# Patient Record
Sex: Male | Born: 1980 | Race: Black or African American | Hispanic: No | Marital: Married | Smoking: Never smoker
Health system: Southern US, Community
[De-identification: ages and names within clinical notes are randomized; demographics above are authoritative.]

## PROBLEM LIST (undated history)

## (undated) DIAGNOSIS — J40 Bronchitis, not specified as acute or chronic: Secondary | ICD-10-CM

---

## 2018-11-29 ENCOUNTER — Ambulatory Visit
Admission: EM | Admit: 2018-11-29 | Discharge: 2018-11-29 | Disposition: A | Discharge status: Home / Self Care | Payer: 59 | Attending: Family Medicine | Admitting: Family Medicine

## 2018-11-29 ENCOUNTER — Encounter: Payer: Self-pay | Admitting: Emergency Medicine

## 2018-11-29 DIAGNOSIS — R51 Headache: Secondary | ICD-10-CM

## 2018-11-29 DIAGNOSIS — R05 Cough: Secondary | ICD-10-CM

## 2018-11-29 DIAGNOSIS — R69 Illness, unspecified: Principal | ICD-10-CM

## 2018-11-29 DIAGNOSIS — J111 Influenza due to unidentified influenza virus with other respiratory manifestations: Secondary | ICD-10-CM

## 2018-11-29 MED ORDER — ONDANSETRON 4 MG PO TBDP: 4.0000 mg | ORAL_TABLET | Freq: Three times a day (TID) | ORAL | 0 refills | Status: DC | PRN

## 2018-11-29 MED ORDER — PSEUDOEPH-BROMPHEN-DM 30-2-10 MG/5ML PO SYRP: 5.0000 mL | ORAL_SOLUTION | Freq: Four times a day (QID) | ORAL | 0 refills | Status: DC | PRN

## 2018-11-29 MED ORDER — ONDANSETRON 4 MG PO TBDP
4.0000 mg | ORAL_TABLET | Freq: Once | ORAL | Status: AC
  Administered 2018-11-29: 4 mg via ORAL

## 2018-11-29 MED ORDER — FLUTICASONE PROPIONATE 50 MCG/ACT NA SUSP: 1.0000 | Freq: Every day | NASAL | 0 refills | Status: DC

## 2018-11-29 NOTE — Discharge Instructions (Signed)
You likely having a viral upper respiratory infection. We recommended symptom control. I expect your symptoms to start improving in the next 1-2 weeks.   Zofran as needed for nausea/vomiting  1. Take a daily allergy pill/anti-histamine like Zyrtec, Claritin, or Store brand consistently for 2 weeks  2. For congestion you may try an oral decongestant like Mucinex or sudafed. You may also try intranasal flonase nasal spray or saline irrigations (neti pot, sinus cleanse)  3. For your sore throat you may try cepacol lozenges, salt water gargles, throat spray. Treatment of congestion may also help your sore throat.  4. For cough you may try Cough syrup provided, Delsym Robitussen, Mucinex DM  5. Take Tylenol or Ibuprofen to help with pain/inflammation  6. Stay hydrated, drink plenty of fluids to keep throat coated and less irritated  Honey Tea For cough/sore throat try using a honey-based tea. Use 3 teaspoons of honey with juice squeezed from half lemon. Place shaved pieces of ginger into 1/2-1 cup of water and warm over stove top. Then mix the ingredients and repeat every 4 hours as needed.

## 2018-11-29 NOTE — ED Notes (Signed)
Patient able to ambulate independently  

## 2018-11-29 NOTE — ED Triage Notes (Signed)
Patient complains of cough, headache, vomiting, and diarrhea X 3-4 days.

## 2018-11-30 NOTE — ED Provider Notes (Signed)
[facility]    CSN: [id number] Arrival date & time: 11/29/18  1741     History   Chief Complaint Chief Complaint  Patient presents with  . Flu-Like Symptoms    HPI Andrew Lara is a 38 y.o. male no contribute past medical history presenting today for evaluation of URI symptoms.  Patient states that he has had a cough, headache.  He is also had a few episodes of vomiting and diarrhea.  Symptoms have been going on for approximately 3 to 4 days.  He has felt feverish, but has not had any known measured fevers.  Denies sore throat.  HPI  History reviewed. No pertinent past medical history.  There are no active problems to display for this patient.   History reviewed. No pertinent surgical history.     Home Medications    Prior to Admission medications   Medication Sig Start Date End Date Taking? Authorizing Provider  brompheniramine-pseudoephedrine-DM 30-2-10 MG/5ML syrup Take 5 mLs by mouth 4 (four) times daily as needed. 11/29/18   Mattisyn Cardona C, PA-C  fluticasone (FLONASE) 50 MCG/ACT nasal spray Place 1-2 sprays into both nostrils daily. 11/29/18   Tanisa Lagace C, PA-C  ondansetron (ZOFRAN-ODT) 4 MG disintegrating tablet Take 1 tablet (4 mg total) by mouth every 8 (eight) hours as needed for nausea or vomiting. 11/29/18   Dineen Conradt, Junius Creamer, PA-C    Family History History reviewed. No pertinent family history.  Social History Social History   Tobacco Use  . Smoking status: Never Smoker  . Smokeless tobacco: Never Used  Substance Use Topics  . Alcohol use: Not Currently  . Drug use: Never     Allergies   Patient has no known allergies.   Review of Systems Review of Systems  Constitutional: Positive for chills and fatigue. Negative for activity change, appetite change and fever.  HENT: Positive for congestion, rhinorrhea and sore throat. Negative for ear pain, sinus pressure and trouble swallowing.   Eyes: Negative for discharge and redness.    Respiratory: Positive for cough. Negative for chest tightness and shortness of breath.   Cardiovascular: Negative for chest pain.  Gastrointestinal: Positive for nausea and vomiting. Negative for abdominal pain and diarrhea.  Musculoskeletal: Negative for myalgias.  Skin: Negative for rash.  Neurological: Negative for dizziness, light-headedness and headaches.     Physical Exam Triage Vital Signs ED Triage Vitals  Enc Vitals Group     BP 11/29/18 1758 131/85     Pulse Rate 11/29/18 1758 98     Resp 11/29/18 1758 16     Temp 11/29/18 1758 97.8 F (36.6 C)     Temp Source 11/29/18 1758 Oral     SpO2 11/29/18 1758 96 %     Weight --      Height --      Head Circumference --      Peak Flow --      Pain Score 11/29/18 1801 3     Pain Loc --      Pain Edu? --      Excl. in GC? --    No data found.  Updated Vital Signs BP 131/85 (BP Location: Left Arm)   Pulse 98   Temp 97.8 F (36.6 C) (Oral)   Resp 16   SpO2 96%   Visual Acuity Right Eye Distance:   Left Eye Distance:   Bilateral Distance:    Right Eye Near:   Left Eye Near:    Bilateral Near:  Physical Exam Vitals signs and nursing note reviewed.  Constitutional:      Appearance: He is well-developed.  HENT:     Head: Normocephalic and atraumatic.     Ears:     Comments: Bilateral ears without tenderness to palpation of external auricle, tragus and mastoid, EAC's without erythema or swelling, TM's with good bony landmarks and cone of light. Non erythematous.    Mouth/Throat:     Comments: Oral mucosa pink and moist, no tonsillar enlargement or exudate. Posterior pharynx patent and nonerythematous, no uvula deviation or swelling. Normal phonation. Eyes:     Conjunctiva/sclera: Conjunctivae normal.  Neck:     Musculoskeletal: Neck supple.  Cardiovascular:     Rate and Rhythm: Normal rate and regular rhythm.     Heart sounds: No murmur.  Pulmonary:     Effort: Pulmonary effort is normal. No respiratory  distress.     Breath sounds: Normal breath sounds.     Comments: Breathing comfortably at rest, CTABL, no wheezing, rales or other adventitious sounds auscultated Abdominal:     Palpations: Abdomen is soft.     Tenderness: There is no abdominal tenderness.  Skin:    General: Skin is warm and dry.  Neurological:     Mental Status: He is alert.      UC Treatments / Results  Labs (all labs ordered are listed, but only abnormal results are displayed) Labs Reviewed - No data to display  EKG None  Radiology No results found.  Procedures Procedures (including critical care time)  Medications Ordered in UC Medications  ondansetron (ZOFRAN-ODT) disintegrating tablet 4 mg (4 mg Oral Given 11/29/18 1833)    Initial Impression / Assessment and Plan / UC Course  I have reviewed the triage vital signs and the nursing notes.  Pertinent labs & imaging results that were available during my care of the patient were reviewed by me and considered in my medical decision making (see chart for details).     URI symptoms x3 to 4 days with associated GI upset, vital signs stable, exam nonfocal.  Most likely viral etiology, possible influenza.  Will recommend symptomatic and supportive care, continue to monitor.  Recommendations below.Discussed strict return precautions. Patient verbalized understanding and is agreeable with plan.  Final Clinical Impressions(s) / UC Diagnoses   Final diagnoses:  Influenza-like illness     Discharge Instructions     You likely having a viral upper respiratory infection. We recommended symptom control. I expect your symptoms to start improving in the next 1-2 weeks.   Zofran as needed for nausea/vomiting  1. Take a daily allergy pill/anti-histamine like Zyrtec, Claritin, or Store brand consistently for 2 weeks  2. For congestion you may try an oral decongestant like Mucinex or sudafed. You may also try intranasal flonase nasal spray or saline irrigations  (neti pot, sinus cleanse)  3. For your sore throat you may try cepacol lozenges, salt water gargles, throat spray. Treatment of congestion may also help your sore throat.  4. For cough you may try Cough syrup provided, Delsym Robitussen, Mucinex DM  5. Take Tylenol or Ibuprofen to help with pain/inflammation  6. Stay hydrated, drink plenty of fluids to keep throat coated and less irritated  Honey Tea For cough/sore throat try using a honey-based tea. Use 3 teaspoons of honey with juice squeezed from half lemon. Place shaved pieces of ginger into 1/2-1 cup of water and warm over stove top. Then mix the ingredients and repeat every 4 hours as  needed.   ED Prescriptions    Medication Sig Dispense Auth. Provider   brompheniramine-pseudoephedrine-DM 30-2-10 MG/5ML syrup Take 5 mLs by mouth 4 (four) times daily as needed. 120 mL Lannette Avellino C, PA-C   fluticasone (FLONASE) 50 MCG/ACT nasal spray Place 1-2 sprays into both nostrils daily. 1 g Advika Mclelland C, PA-C   ondansetron (ZOFRAN-ODT) 4 MG disintegrating tablet Take 1 tablet (4 mg total) by mouth every 8 (eight) hours as needed for nausea or vomiting. 20 tablet Travonte Byard, WestonHallie C, PA-C     Controlled Substance Prescriptions [place] Controlled Substance Registry consulted? Not Applicable   Lew DawesWieters, Edan Juday C, [place] 11/30/18 2045

## 2019-02-17 ENCOUNTER — Encounter (HOSPITAL_COMMUNITY): Payer: Self-pay | Admitting: Emergency Medicine

## 2019-02-17 ENCOUNTER — Other Ambulatory Visit: Payer: Self-pay

## 2019-02-17 ENCOUNTER — Emergency Department (HOSPITAL_COMMUNITY)
Admission: EM | Admit: 2019-02-17 | Discharge: 2019-02-18 | Disposition: A | Discharge status: Home / Self Care | Payer: 59 | Attending: Emergency Medicine | Admitting: Emergency Medicine

## 2019-02-17 DIAGNOSIS — R5383 Other fatigue: Secondary | ICD-10-CM | POA: Diagnosis present

## 2019-02-17 DIAGNOSIS — R319 Hematuria, unspecified: Secondary | ICD-10-CM | POA: Diagnosis not present

## 2019-02-17 DIAGNOSIS — Z20828 Contact with and (suspected) exposure to other viral communicable diseases: Secondary | ICD-10-CM | POA: Diagnosis not present

## 2019-02-17 DIAGNOSIS — R112 Nausea with vomiting, unspecified: Secondary | ICD-10-CM | POA: Insufficient documentation

## 2019-02-17 DIAGNOSIS — J989 Respiratory disorder, unspecified: Secondary | ICD-10-CM | POA: Diagnosis present

## 2019-02-17 DIAGNOSIS — R05 Cough: Secondary | ICD-10-CM | POA: Insufficient documentation

## 2019-02-17 DIAGNOSIS — E860 Dehydration: Secondary | ICD-10-CM | POA: Diagnosis not present

## 2019-02-17 DIAGNOSIS — R509 Fever, unspecified: Secondary | ICD-10-CM | POA: Diagnosis not present

## 2019-02-17 DIAGNOSIS — R1032 Left lower quadrant pain: Secondary | ICD-10-CM | POA: Insufficient documentation

## 2019-02-17 LAB — CBC
HCT: 47.8 % (ref 39.0–52.0)
Hemoglobin: 15.5 g/dL (ref 13.0–17.0)
MCH: 26.5 pg (ref 26.0–34.0)
MCHC: 32.4 g/dL (ref 30.0–36.0)
MCV: 81.6 fL (ref 80.0–100.0)
Platelets: 340 10*3/uL (ref 150–400)
RBC: 5.86 MIL/uL — ABNORMAL HIGH (ref 4.22–5.81)
RDW: 13.6 % (ref 11.5–15.5)
WBC: 10.4 10*3/uL (ref 4.0–10.5)
nRBC: 0 % (ref 0.0–0.2)

## 2019-02-17 LAB — COMPREHENSIVE METABOLIC PANEL
ALT: 26 U/L (ref 0–44)
AST: 20 U/L (ref 15–41)
Albumin: 4.4 g/dL (ref 3.5–5.0)
Alkaline Phosphatase: 93 U/L (ref 38–126)
Anion gap: 14 (ref 5–15)
BUN: 6 mg/dL (ref 6–20)
CO2: 21 mmol/L — ABNORMAL LOW (ref 22–32)
Calcium: 9.7 mg/dL (ref 8.9–10.3)
Chloride: 103 mmol/L (ref 98–111)
Creatinine, Ser: 1.33 mg/dL — ABNORMAL HIGH (ref 0.61–1.24)
GFR calc Af Amer: >60 mL/min (ref >60)
GFR calc non Af Amer: >60 mL/min (ref >60)
Glucose, Bld: 113 mg/dL — ABNORMAL HIGH (ref 70–99)
Potassium: 3.8 mmol/L (ref 3.5–5.1)
Sodium: 138 mmol/L (ref 135–145)
Total Bilirubin: 1.2 mg/dL (ref 0.3–1.2)
Total Protein: 8 g/dL (ref 6.5–8.1)

## 2019-02-17 LAB — LIPASE, BLOOD: Lipase: 25 U/L (ref 11–51)

## 2019-02-17 MED ORDER — SODIUM CHLORIDE 0.9% FLUSH
3.0000 mL | Freq: Once | INTRAVENOUS | Status: AC
  Administered 2019-02-18: 01:00:00 3 mL via INTRAVENOUS

## 2019-02-17 NOTE — ED Provider Notes (Signed)
Emergency Department Provider Note   I have reviewed the triage vital signs and the nursing notes.   HISTORY  Chief Complaint Fatigue and Nausea   HPI Andrew Lara is a 38 y.o. male who presents the emergency department today for weakness and vomiting.  Patient states that he has been having a cough and generalized weakness for a few days is progressively worsened.  Had a T-max at home of 101.4.  Today he had even worsening cough and started having nausea and vomiting as well.  No other associated symptoms.  Patient states he never had a kidney stone.  Patient states he has been analysis been sick and he feels like he did when he had pneumonia.  He did not get a flu shot this year.   No other associated or modifying symptoms.    History reviewed. No pertinent past medical history.  Patient Active Problem List   Diagnosis Date Noted   Respiratory illness 02/18/2019    History reviewed. No pertinent surgical history.  Current Outpatient Rx   Order #: [id number] Class: Normal   Order #: [id number] Class: Normal   Order #: [id number] Class: Normal   Order #: [id number] Class: Normal   Order #: [id number] Class: Normal    Allergies Patient has no known allergies.  No family history on file.  Social History Social History   Tobacco Use   Smoking status: Never Smoker   Smokeless tobacco: Never Used  Substance Use Topics   Alcohol use: Not Currently   Drug use: Never    Review of Systems  All other systems negative except as documented in the HPI. All pertinent positives and negatives as reviewed in the HPI. ____________________________________________   PHYSICAL EXAM:  VITAL SIGNS: ED Triage Vitals [02/17/19 1837]  Enc Vitals Group     BP (!) 135/98     Pulse Rate (!) 124     Resp 18     Temp 99.3 F (37.4 C)     Temp Source Oral     SpO2 98 %    Constitutional: Alert and oriented. Well appearing and in no acute distress. Eyes: Conjunctivae are  normal. PERRL. EOMI. Head: Atraumatic. Nose: No congestion/rhinnorhea. Mouth/Throat: Mucous membranes are moist.  Oropharynx non-erythematous. Neck: No stridor.  No meningeal signs.   Cardiovascular: tachycardic rate, regular rhythm. Good peripheral circulation. Grossly normal heart sounds.   Respiratory: Normal respiratory effort.  No retractions. Lungs CTAB. Gastrointestinal: Soft and nontender. No distention.  Musculoskeletal: No lower extremity tenderness nor edema. No gross deformities of extremities. L CVA ttp.  Neurologic:  Normal speech and language. No gross focal neurologic deficits are appreciated.  Skin:  Skin is warm, dry and intact. No rash noted.   ____________________________________________   LABS (all labs ordered are listed, but only abnormal results are displayed)  Labs Reviewed  COMPREHENSIVE METABOLIC PANEL - Abnormal; Notable for the following components:      Result Value   CO2 21 (*)    Glucose, Bld 113 (*)    Creatinine, Ser 1.33 (*)    All other components within normal limits  CBC - Abnormal; Notable for the following components:   RBC 5.86 (*)    All other components within normal limits  URINALYSIS, ROUTINE W REFLEX MICROSCOPIC - Abnormal; Notable for the following components:   Hgb urine dipstick SMALL (*)    Ketones, ur 20 (*)    Protein, ur 30 (*)    All other components within normal limits  SARS CORONAVIRUS 2 (HOSPITAL ORDER,  PERFORMED IN [facility])  LIPASE, BLOOD  INFLUENZA PANEL BY PCR (TYPE A & B)   ____________________________________________  EKG   EKG Interpretation  Date/Time:    Ventricular Rate:    PR Interval:    QRS Duration:   QT Interval:    QTC Calculation:   R Axis:     Text Interpretation:         ____________________________________________  RADIOLOGY  Dg Chest Portable 1 View  Result Date: 02/18/2019 CLINICAL DATA:  Fever, cough EXAM: PORTABLE CHEST 1 VIEW COMPARISON:  None. FINDINGS:  Cardiomegaly. No confluent airspace opacities or effusions. No edema. No acute bony abnormality. IMPRESSION: Cardiomegaly.  No active disease. Electronically Signed   By: Charlett Nose M.D.   On: 02/18/2019 00:56   Ct Renal Stone Study  Result Date: 02/18/2019 CLINICAL DATA:  Fever EXAM: CT ABDOMEN AND PELVIS WITHOUT CONTRAST TECHNIQUE: Multidetector CT imaging of the abdomen and pelvis was performed following the standard protocol without IV contrast. COMPARISON:  None. FINDINGS: Lower chest: Lung bases are clear. No effusions. Heart is normal size. Hepatobiliary: No focal hepatic abnormality. Gallbladder unremarkable. Pancreas: No focal abnormality or ductal dilatation. Spleen: No focal abnormality.  Normal size. Adrenals/Urinary Tract: No adrenal abnormality. No focal renal abnormality. No stones or hydronephrosis. Urinary bladder is unremarkable. Stomach/Bowel: Stomach, large and small bowel grossly unremarkable. Normal appendix. Vascular/Lymphatic: No evidence of aneurysm or adenopathy. Reproductive: No visible focal abnormality. Other: No free fluid or free air. Musculoskeletal: No acute bony abnormality. IMPRESSION: No renal or ureteral stones.  No hydronephrosis. No acute findings in the abdomen or pelvis. Normal appendix. Electronically Signed   By: Charlett Nose M.D.   On: 02/18/2019 03:05    ____________________________________________   PROCEDURES  Procedure(s) performed:   Procedures   ____________________________________________   INITIAL IMPRESSION / ASSESSMENT AND PLAN / ED COURSE   Possibly respiratory infection will check for flu and and also will evaluate for coronavirus as he may need to be admitted if he cant tolerate p.o.  Also consider kidney stone as he has hematuria and left CVA tenderness.  Could also be pyelonephritis from that standpoint.  We will get a CT scan to evaluate further.  Pulse improved with fluids/antipyretics. Pain improved. Urinating more normally.  Urine had blood but ct negative for stone.   Will start z pack secondary to respiratory symptoms with fever at home. Still could be viral in nature.    Pertinent labs & imaging results that were available during my care of the patient were reviewed by me and considered in my medical decision making (see chart for details).  A medical screening exam was performed and I feel the patient has had an appropriate workup for their chief complaint at this time and likelihood of emergent condition existing is low. They have been counseled on decision, discharge, follow up and which symptoms necessitate immediate return to the emergency department. They or their family verbally stated understanding and agreement with plan and discharged in stable condition.   ____________________________________________  FINAL CLINICAL IMPRESSION(S) / ED DIAGNOSES  Final diagnoses:  Dehydration  Respiratory illness     MEDICATIONS GIVEN DURING THIS VISIT:  Medications  sodium chloride flush (NS) 0.9 % injection 3 mL (3 mLs Intravenous Given 02/18/19 0108)  lactated ringers bolus 2,000 mL (0 mLs Intravenous Stopped 02/18/19 0341)  ketorolac (TORADOL) 30 MG/ML injection 15 mg (15 mg Intravenous Given 02/18/19 0107)  ondansetron (ZOFRAN) injection 4 mg (4 mg Intravenous Given 02/18/19 0108)  NEW OUTPATIENT MEDICATIONS STARTED DURING THIS VISIT:  New Prescriptions   AZITHROMYCIN (ZITHROMAX) 250 MG TABLET    Take 1 tablet (250 mg total) by mouth daily. Take first 2 tablets together, then 1 every day until finished.   ONDANSETRON (ZOFRAN) 4 MG TABLET    Take 1 tablet (4 mg total) by mouth every 8 (eight) hours as needed for nausea or vomiting.    Note:  This note was prepared with assistance of Dragon voice recognition software. Occasional wrong-word or sound-a-like substitutions may have occurred due to the inherent limitations of voice recognition software.   Kha Hari, Barbara Cower, MD 02/18/19 [phone]

## 2019-02-17 NOTE — ED Triage Notes (Signed)
Pt reports fever and cough yesterday, nausea and vomiting developed today. Pt A&O x 4

## 2019-02-17 NOTE — ED Notes (Signed)
ED Provider at bedside. 

## 2019-02-18 ENCOUNTER — Emergency Department (HOSPITAL_COMMUNITY): Payer: 59

## 2019-02-18 DIAGNOSIS — J989 Respiratory disorder, unspecified: Secondary | ICD-10-CM | POA: Diagnosis present

## 2019-02-18 LAB — URINALYSIS, ROUTINE W REFLEX MICROSCOPIC
Bacteria, UA: NONE SEEN
Bilirubin Urine: NEGATIVE
Glucose, UA: NEGATIVE mg/dL
Ketones, ur: 20 mg/dL — AB
Leukocytes,Ua: NEGATIVE
Nitrite: NEGATIVE
Protein, ur: 30 mg/dL — AB
Specific Gravity, Urine: 1.021 (ref 1.005–1.030)
pH: 7 (ref 5.0–8.0)

## 2019-02-18 LAB — SARS CORONAVIRUS 2 BY RT PCR (HOSPITAL ORDER, PERFORMED IN ~~LOC~~ HOSPITAL LAB): SARS Coronavirus 2: NEGATIVE

## 2019-02-18 LAB — INFLUENZA PANEL BY PCR (TYPE A & B)
Influenza A By PCR: NEGATIVE
Influenza B By PCR: NEGATIVE

## 2019-02-18 MED ORDER — ONDANSETRON HCL 4 MG PO TABS: 4.0000 mg | ORAL_TABLET | Freq: Three times a day (TID) | ORAL | 0 refills | Status: DC | PRN

## 2019-02-18 MED ORDER — AZITHROMYCIN 250 MG PO TABS: 250.0000 mg | ORAL_TABLET | Freq: Every day | ORAL | 0 refills | Status: DC

## 2019-02-18 MED ORDER — LACTATED RINGERS IV BOLUS
2000.0000 mL | Freq: Once | INTRAVENOUS | Status: AC
  Administered 2019-02-18: 01:00:00 2000 mL via INTRAVENOUS

## 2019-02-18 MED ORDER — ONDANSETRON HCL 4 MG/2ML IJ SOLN
4.0000 mg | Freq: Once | INTRAMUSCULAR | Status: AC
  Administered 2019-02-18: 01:00:00 4 mg via INTRAVENOUS
  Filled 2019-02-18: qty 2

## 2019-02-18 MED ORDER — KETOROLAC TROMETHAMINE 30 MG/ML IJ SOLN
15.0000 mg | Freq: Once | INTRAMUSCULAR | Status: AC
  Administered 2019-02-18: 15 mg via INTRAVENOUS
  Filled 2019-02-18: qty 1

## 2019-02-18 NOTE — ED Notes (Signed)
Patient verbalized understanding of dc instructions, vss, ambulatory with nad.   

## 2019-02-28 ENCOUNTER — Ambulatory Visit
Admission: EM | Admit: 2019-02-28 | Discharge: 2019-02-28 | Disposition: A | Discharge status: Home / Self Care | Payer: 59 | Attending: Physician Assistant | Admitting: Physician Assistant

## 2019-02-28 DIAGNOSIS — J069 Acute upper respiratory infection, unspecified: Secondary | ICD-10-CM

## 2019-02-28 DIAGNOSIS — B9789 Other viral agents as the cause of diseases classified elsewhere: Secondary | ICD-10-CM

## 2019-02-28 MED ORDER — BENZONATATE 100 MG PO CAPS: 100.0000 mg | ORAL_CAPSULE | Freq: Four times a day (QID) | ORAL | 0 refills | Status: DC | PRN

## 2019-02-28 NOTE — ED Triage Notes (Signed)
Pt states has been out of work d/t URI, needs a return note. States tested neg for COVID-19 and flu.

## 2019-03-02 NOTE — ED Provider Notes (Signed)
[facility]    CSN: [id number] Arrival date & time: 02/28/19  1754     History   Chief Complaint Chief Complaint  Patient presents with  . needs a work note    HPI Andrew Lara is a 38 y.o. male.   The history is provided by the patient. No language interpreter was used.  Cough  Cough characteristics:  Non-productive Sputum characteristics:  Nondescript Severity:  Mild Onset quality:  Gradual Duration:  1 week Timing:  Constant Progression:  Worsening Chronicity:  New Smoker: no   Relieved by:  Nothing Worsened by:  Nothing Ineffective treatments:  None tried Associated symptoms: no fever, no rhinorrhea, no shortness of breath, no sinus congestion and no sore throat     History reviewed. No pertinent past medical history.  Patient Active Problem List   Diagnosis Date Noted  . Respiratory illness 02/18/2019    History reviewed. No pertinent surgical history.     Home Medications    Prior to Admission medications   Medication Sig Start Date End Date Taking? Authorizing Provider  benzonatate (TESSALON PERLES) 100 MG capsule Take 1 capsule (100 mg total) by mouth every 6 (six) hours as needed for cough. 02/28/19 02/28/20  Elson Areas, PA-C  brompheniramine-pseudoephedrine-DM 30-2-10 MG/5ML syrup Take 5 mLs by mouth 4 (four) times daily as needed. 11/29/18   Wieters, Hallie C, PA-C  fluticasone (FLONASE) 50 MCG/ACT nasal spray Place 1-2 sprays into both nostrils daily. 11/29/18   Wieters, Hallie C, PA-C  ondansetron (ZOFRAN) 4 MG tablet Take 1 tablet (4 mg total) by mouth every 8 (eight) hours as needed for nausea or vomiting. 02/18/19   Mesner, Barbara Cower, MD  ondansetron (ZOFRAN-ODT) 4 MG disintegrating tablet Take 1 tablet (4 mg total) by mouth every 8 (eight) hours as needed for nausea or vomiting. 11/29/18   Wieters, Junius Creamer, PA-C    Family History No family history on file.  Social History Social History   Tobacco Use  . Smoking status: Never  Smoker  . Smokeless tobacco: Never Used  Substance Use Topics  . Alcohol use: Not Currently  . Drug use: Never     Allergies   Patient has no known allergies.   Review of Systems Review of Systems  Constitutional: Negative for fever.  HENT: Negative for rhinorrhea and sore throat.   Respiratory: Positive for cough. Negative for shortness of breath.   All other systems reviewed and are negative.    Physical Exam Triage Vital Signs ED Triage Vitals  Enc Vitals Group     BP 02/28/19 1802 120/79     Pulse Rate 02/28/19 1802 85     Resp 02/28/19 1802 16     Temp 02/28/19 1802 98.6 F (37 C)     Temp Source 02/28/19 1802 Oral     SpO2 02/28/19 1802 97 %     Weight --      Height --      Head Circumference --      Peak Flow --      Pain Score 02/28/19 1805 0     Pain Loc --      Pain Edu? --      Excl. in GC? --    No data found.  Updated Vital Signs BP 120/79 (BP Location: Left Arm)   Pulse 85   Temp 98.6 F (37 C) (Oral)   Resp 16   SpO2 97%   Visual Acuity Right Eye Distance:   Left Eye  Distance:   Bilateral Distance:    Right Eye Near:   Left Eye Near:    Bilateral Near:     Physical Exam Vitals signs reviewed.  HENT:     Head: Normocephalic.     Right Ear: Tympanic membrane normal.     Left Ear: Tympanic membrane normal.     Nose: Nose normal.     Mouth/Throat:     Mouth: Mucous membranes are moist.  Neck:     Musculoskeletal: Normal range of motion.  Cardiovascular:     Rate and Rhythm: Normal rate.     Pulses: Normal pulses.  Pulmonary:     Effort: Pulmonary effort is normal.  Abdominal:     General: Abdomen is flat.  Musculoskeletal: Normal range of motion.  Skin:    General: Skin is warm.  Neurological:     General: No focal deficit present.     Mental Status: He is alert.  Psychiatric:        Mood and Affect: Mood normal.      UC Treatments / Results  Labs (all labs ordered are listed, but only abnormal results are  displayed) Labs Reviewed - No data to display  EKG None  Radiology No results found.  Procedures Procedures (including critical care time)  Medications Ordered in UC Medications - No data to display  Initial Impression / Assessment and Plan / UC Course  I have reviewed the triage vital signs and the nursing notes.  Pertinent labs & imaging results that were available during my care of the patient were reviewed by me and considered in my medical decision making (see chart for details).     MDM  Pt is feeling better.  Pt given a note to return to work.  I will also given tessalon for cough.  Final Clinical Impressions(s) / UC Diagnoses   Final diagnoses:  Acute upper respiratory infection   Discharge Instructions   None    ED Prescriptions    Medication Sig Dispense Auth. Provider   benzonatate (TESSALON PERLES) 100 MG capsule Take 1 capsule (100 mg total) by mouth every 6 (six) hours as needed for cough. 30 capsule Elson AreasSofia,  K, [place]-C     Controlled Substance Prescriptions [place] Controlled Substance Registry consulted? Not Applicable  An After Visit Summary was printed and given to the patient.    Elson AreasSofia,  K, [place]-C 03/02/19 1205

## 2019-05-24 ENCOUNTER — Encounter: Payer: Self-pay | Admitting: Emergency Medicine

## 2019-05-24 ENCOUNTER — Ambulatory Visit
Admission: EM | Admit: 2019-05-24 | Discharge: 2019-05-24 | Disposition: A | Discharge status: Home / Self Care | Payer: 59 | Attending: Emergency Medicine | Admitting: Emergency Medicine

## 2019-05-24 ENCOUNTER — Other Ambulatory Visit: Payer: Self-pay

## 2019-05-24 DIAGNOSIS — R05 Cough: Secondary | ICD-10-CM

## 2019-05-24 DIAGNOSIS — R059 Cough, unspecified: Secondary | ICD-10-CM

## 2019-05-24 DIAGNOSIS — Z20828 Contact with and (suspected) exposure to other viral communicable diseases: Secondary | ICD-10-CM

## 2019-05-24 HISTORY — DX: Bronchitis, not specified as acute or chronic: J40

## 2019-05-24 MED ORDER — BENZONATATE 100 MG PO CAPS: 100.0000 mg | ORAL_CAPSULE | Freq: Three times a day (TID) | ORAL | 0 refills | Status: AC | PRN

## 2019-05-24 NOTE — ED Triage Notes (Signed)
Pt presents to [facility] for assessment of cough starting 3 days and worsening.  Coughing consistently during triage.  Hx of bronchitis.  C/o central chest pain sometimes with coughing, and deep inspiration.  Patient states he coughs so hard he vomits sometimes.  Patient c/o some nausea.  Denies abdominal pain, c/o diarrhea.  Patient c/o decreased appetite.  C/o headaches.

## 2019-05-24 NOTE — Discharge Instructions (Addendum)
Your COVID test is pending: Is important you quarantine at home until your results are back. °You may take OTC Tylenol for fever and myalgias.  It is important to drink plenty of water throughout the day to stay hydrated. °If you test positive and later develop severe fever, cough, or shortness of breath, it is recommended that you go to the ER for further evaluation. °

## 2019-05-24 NOTE — ED Provider Notes (Signed)
[facility]    CSN: [id number] Arrival date & time: 05/24/19  1732     History   Chief Complaint Chief Complaint  Patient presents with  . Cough    HPI Andrew Lara is a 38 y.o. male presenting for 3-day course of dry, nonproductive cough.  Patient has remote history of bronchitis, states he usually improves with steroids.  Patient feels this is slightly different though as he has not had any shortness of breath.  Patient endorsing pleuritic chest pain that is non-exertional, improves with cough sensation.  Patient is not taking anything for this.  Patient is also had some waxing/waning generalized abdominal pain, diarrhea, decreased appetite, headaches.  Patient states stools are looser than normal, without blood or melena.  Patient states he did vomit from coughing so hard the other night, though did not produce much emesis.  Patient inquired about COVID testing: Denies known COVID positive contacts, fever, or recent travel; though patient endorsing numerous sick contacts at work.   Past Medical History:  Diagnosis Date  . Bronchitis     Patient Active Problem List   Diagnosis Date Noted  . Respiratory illness 02/18/2019    History reviewed. No pertinent surgical history.     Home Medications    Prior to Admission medications   Medication Sig Start Date End Date Taking? Authorizing Provider  benzonatate (TESSALON PERLES) 100 MG capsule Take 1 capsule (100 mg total) by mouth 3 (three) times daily as needed for cough. 05/24/19 05/23/20  Hall-Potvin, [place], PA-C  fluticasone (FLONASE) 50 MCG/ACT nasal spray Place 1-2 sprays into both nostrils daily. 11/29/18 05/24/19  Wieters, Junius CreamerHallie C, PA-C    Family History History reviewed. No pertinent family history.  Social History Social History   Tobacco Use  . Smoking status: Never Smoker  . Smokeless tobacco: Never Used  Substance Use Topics  . Alcohol use: Not Currently  . Drug use: Never     Allergies    Patient has no known allergies.   Review of Systems Review of Systems  Constitutional: Negative for fatigue and fever.  Respiratory: Positive for cough. Negative for choking, chest tightness, shortness of breath and wheezing.   Cardiovascular: Negative for chest pain and palpitations.  Gastrointestinal: Positive for diarrhea. Negative for abdominal pain and vomiting.  Musculoskeletal: Negative for arthralgias and myalgias.  Skin: Negative for rash and wound.  Neurological: Positive for headaches. Negative for speech difficulty, weakness and light-headedness.  All other systems reviewed and are negative.    Physical Exam Triage Vital Signs ED Triage Vitals [05/24/19 1754]  Enc Vitals Group     BP (!) 132/91     Pulse Rate 85     Resp 20     Temp 99.2 F (37.3 C)     Temp Source Oral     SpO2 97 %     Weight      Height      Head Circumference      Peak Flow      Pain Score 6     Pain Loc      Pain Edu?      Excl. in GC?    No data found.  Updated Vital Signs BP (!) 132/91 (BP Location: Right Arm)   Pulse 85   Temp 99.2 F (37.3 C) (Oral)   Resp 20   SpO2 97%   Visual Acuity Right Eye Distance:   Left Eye Distance:   Bilateral Distance:    Right Eye Near:  Left Eye Near:    Bilateral Near:     Physical Exam Constitutional:      General: He is not in acute distress.    Appearance: He is not ill-appearing.  HENT:     Head: Normocephalic and atraumatic.     Nose: Nose normal.     Mouth/Throat:     Mouth: Mucous membranes are moist.     Pharynx: No oropharyngeal exudate or posterior oropharyngeal erythema.  Eyes:     General: No scleral icterus.       Right eye: No discharge.        Left eye: No discharge.     Conjunctiva/sclera: Conjunctivae normal.     Pupils: Pupils are equal, round, and reactive to light.  Neck:     Musculoskeletal: Normal range of motion and neck supple. No muscular tenderness.  Cardiovascular:     Rate and Rhythm: Normal  rate and regular rhythm.     Heart sounds: Normal heart sounds.  Pulmonary:     Effort: Pulmonary effort is normal. No respiratory distress.     Breath sounds: No wheezing.  Chest:     Chest wall: No tenderness.  Abdominal:     General: Bowel sounds are normal.     Tenderness: There is no abdominal tenderness.  Skin:    Coloration: Skin is not jaundiced or pale.  Neurological:     General: No focal deficit present.     Mental Status: He is alert and oriented to person, place, and time.     Gait: Gait normal.      UC Treatments / Results  Labs (all labs ordered are listed, but only abnormal results are displayed) Labs Reviewed  NOVEL CORONAVIRUS, NAA    EKG   Radiology No results found.  Procedures Procedures (including critical care time)  Medications Ordered in UC Medications - No data to display  Initial Impression / Assessment and Plan / UC Course  I have reviewed the triage vital signs and the nursing notes.  Pertinent labs & imaging results that were available during my care of the patient were reviewed by me and considered in my medical decision making (see chart for details).     1.  Cough Cover test pending: We will isolate until his results are back.  Patient with good O2 saturation, physical exam reassuring, and has a short duration of symptoms: Radiography deferred at this time.  Will treat supportively and with benzonatate as patient has taken these with relief in the past.  Work note provided.  Return precautions discussed, patient verbalized understanding and is agreeable to plan. Final Clinical Impressions(s) / UC Diagnoses   Final diagnoses:  Cough     Discharge Instructions     Your COVID test is pending: Is important you quarantine at home until your results are back. You may take OTC Tylenol for fever and myalgias.  It is important to drink plenty of water throughout the day to stay hydrated. If you test positive and later develop severe  fever, cough, or shortness of breath, it is recommended that you go to the ER for further evaluation.    ED Prescriptions    Medication Sig Dispense Auth. Provider   benzonatate (TESSALON PERLES) 100 MG capsule Take 1 capsule (100 mg total) by mouth 3 (three) times daily as needed for cough. 30 capsule Hall-Potvin, [place], PA-C     Controlled Substance Prescriptions [place] Controlled Substance Registry consulted? Not Applicable   Quincy Sheehan, Hershal Coria 05/24/19  1940  

## 2019-05-24 NOTE — ED Notes (Signed)
Patient able to ambulate independently  

## 2019-05-26 LAB — NOVEL CORONAVIRUS, NAA: SARS-CoV-2, NAA: NOT DETECTED

## 2019-05-27 ENCOUNTER — Encounter (HOSPITAL_COMMUNITY): Payer: Self-pay

## 2019-11-26 IMAGING — CT CT RENAL STONE PROTOCOL
2 of 4 series · 17 of 46 positions shown, 19 images · non-contrast
Comparison: None.

CLINICAL DATA: Fever

EXAM:
CT ABDOMEN AND PELVIS WITHOUT CONTRAST
TECHNIQUE: Multidetector CT imaging of the abdomen and pelvis was performed
following the standard protocol without IV contrast.

[Series 3: renal stone 5.0 · axial · 0.80mm/px · z∈[+729,+1199]mm · 14 of 104 slices shown, 16 images]
[im 5/104  soft-tissue]
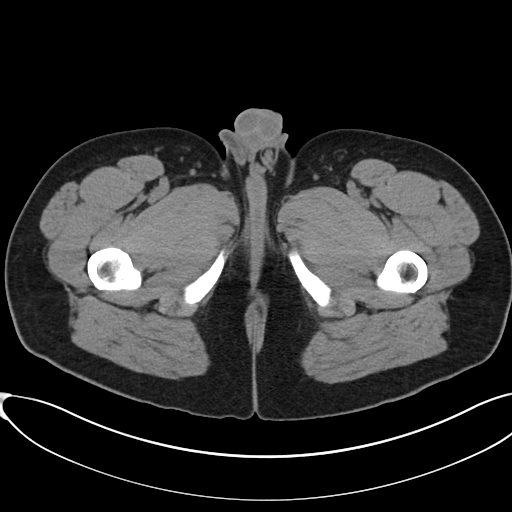
[im 5/104  bone]
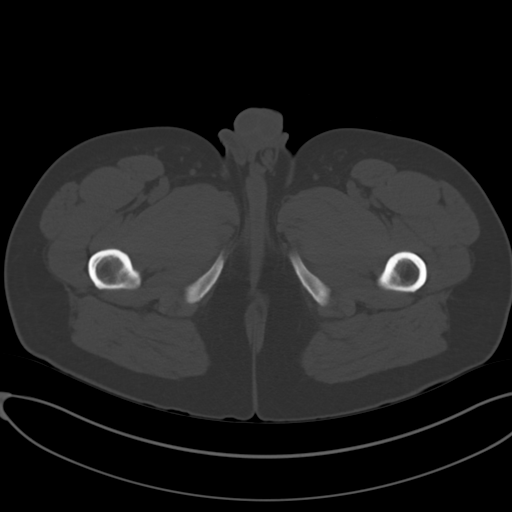
[im 13/104  soft-tissue]
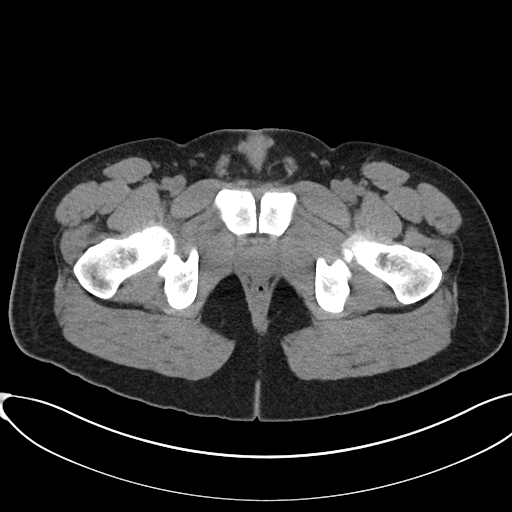
[im 22/104  soft-tissue]
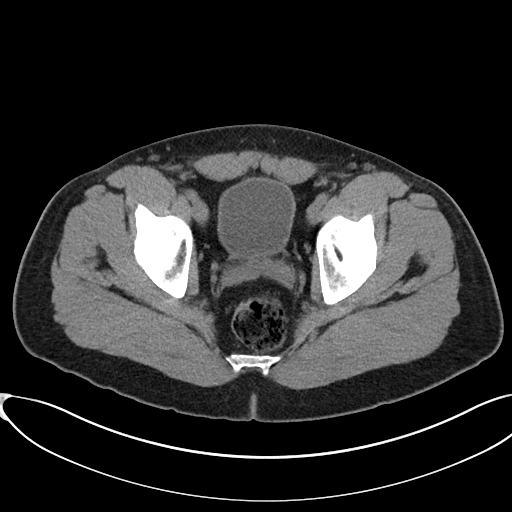
[im 26/104  soft-tissue]
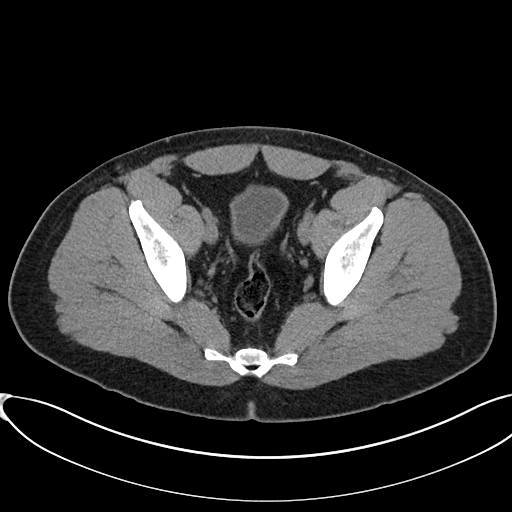
[im 35/104  soft-tissue]
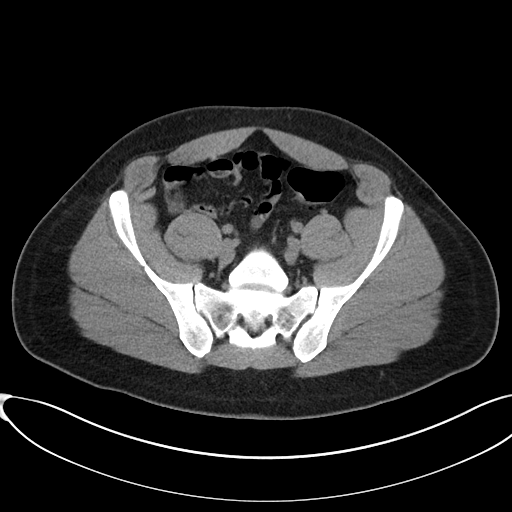
[im 43/104  soft-tissue]
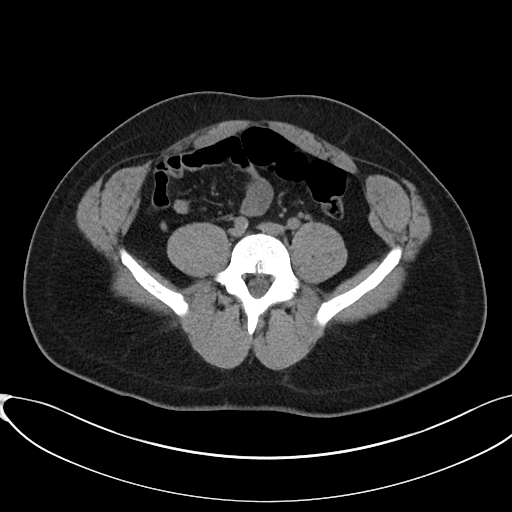
[im 48/104  soft-tissue]
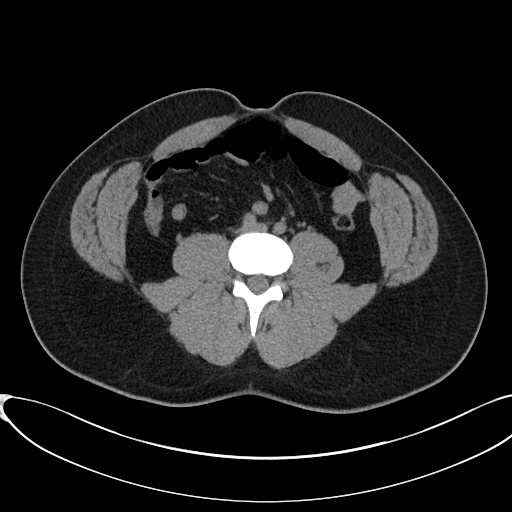
[im 56/104  soft-tissue]
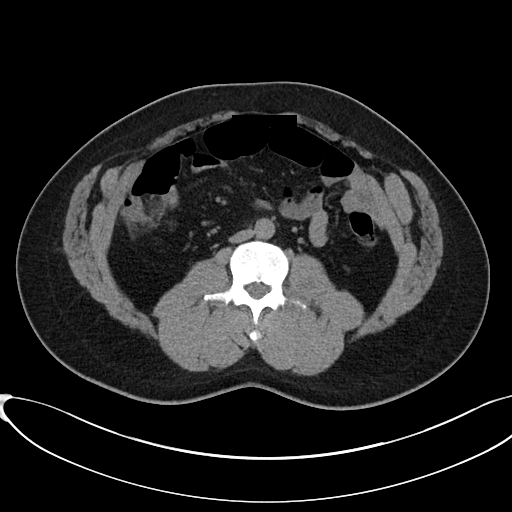
[im 61/104  soft-tissue]
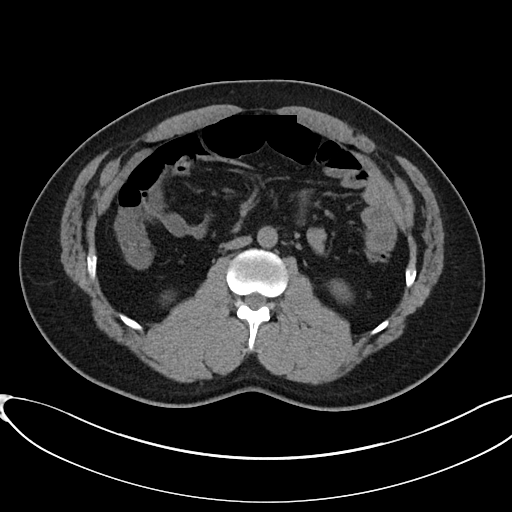
[im 61/104  bone]
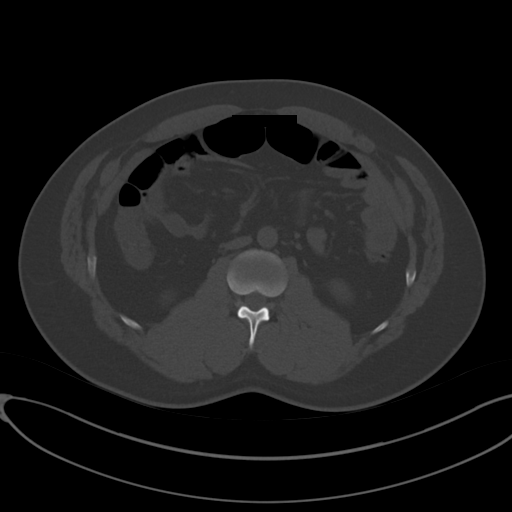
[im 69/104  soft-tissue]
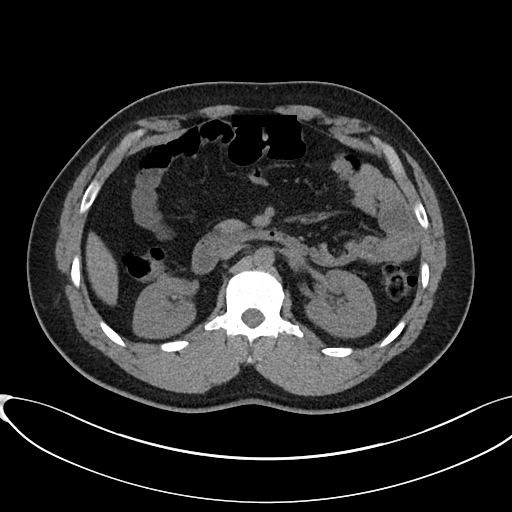
[im 78/104  soft-tissue]
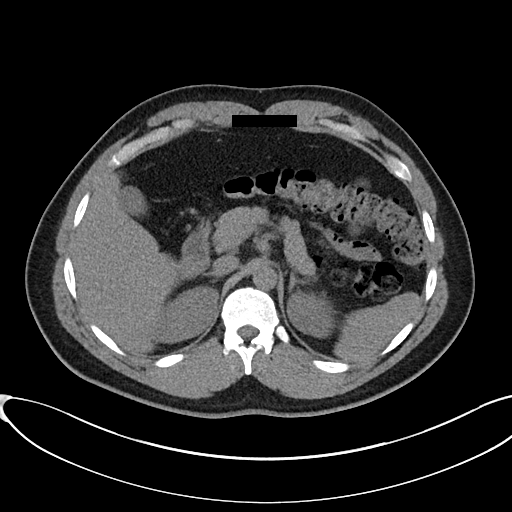
[im 82/104  soft-tissue]
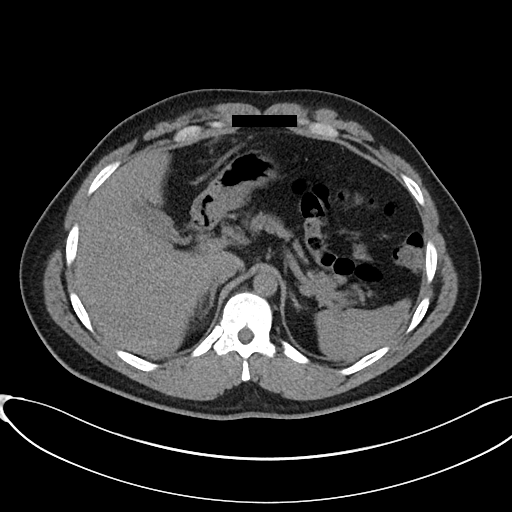
[im 91/104  soft-tissue]
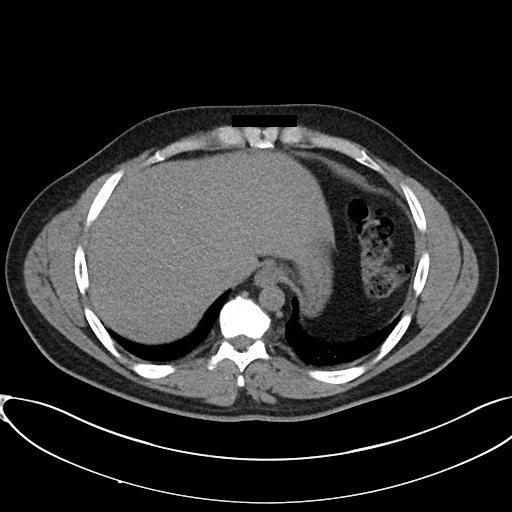
[im 99/104  soft-tissue]
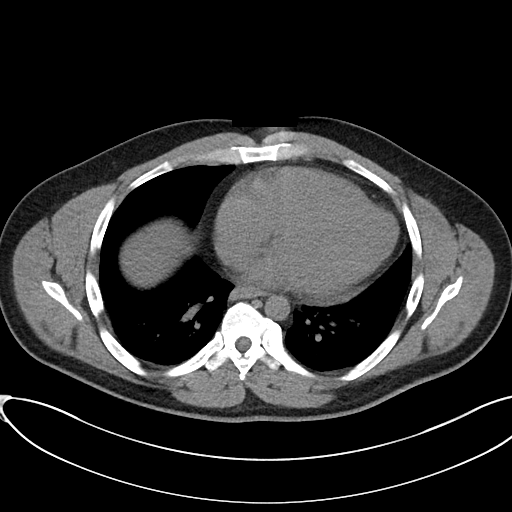

[Series 5: renal stone 3.0 cor · coronal · 0.98mm/px · 3 of 98 slices shown]
[im 33/98  soft-tissue]
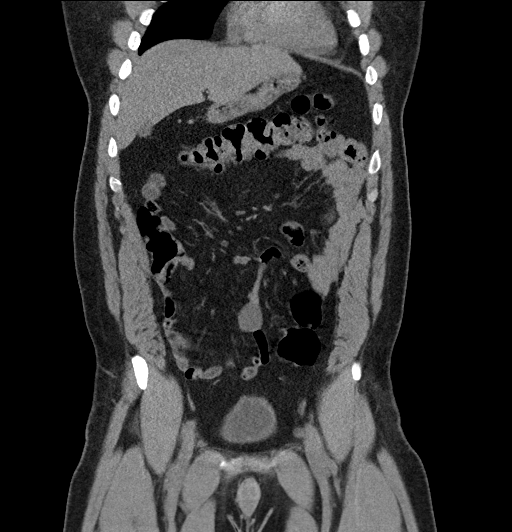
[im 44/98  soft-tissue]
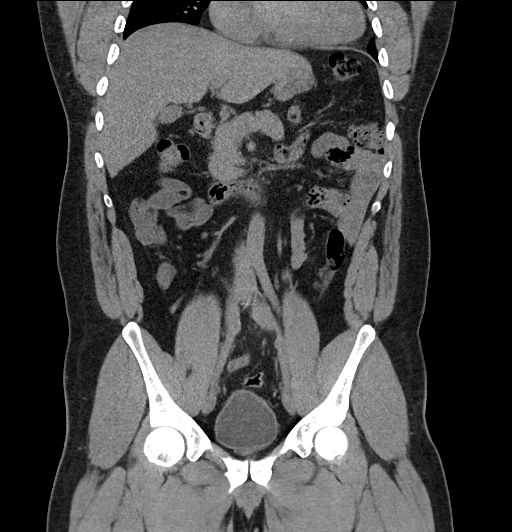
[im 54/98  soft-tissue]
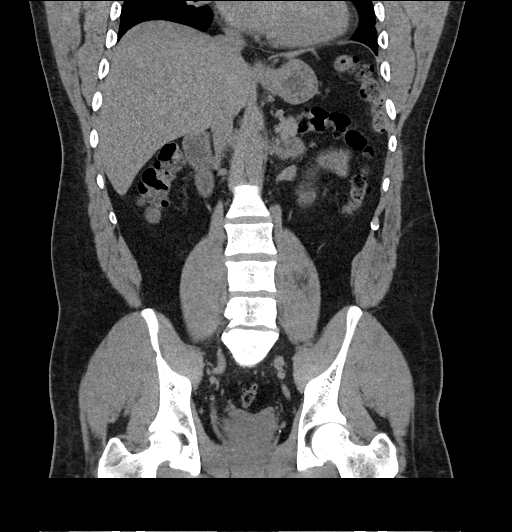

[17 of 46 positions shown; findings below may reference images not displayed]

FINDINGS: Lower chest: Lung bases are clear. No effusions. Heart is normal
size.

Hepatobiliary: No focal hepatic abnormality. Gallbladder
unremarkable.

Pancreas: No focal abnormality or ductal dilatation.

Spleen: No focal abnormality.  Normal size.

Adrenals/Urinary Tract: No adrenal abnormality. No focal renal
abnormality. No stones or hydronephrosis. Urinary bladder is
unremarkable.

Stomach/Bowel: Stomach, large and small bowel grossly unremarkable.
Normal appendix.

Vascular/Lymphatic: No evidence of aneurysm or adenopathy.

Reproductive: No visible focal abnormality.

Other: No free fluid or free air.

Musculoskeletal: No acute bony abnormality.
IMPRESSION: No renal or ureteral stones.  No hydronephrosis.

No acute findings in the abdomen or pelvis.

Normal appendix.
